# Patient Record
Sex: Male | Born: 1970 | Race: Black or African American | Hispanic: No | Marital: Married | Smoking: Never smoker
Health system: Southern US, Community
[De-identification: ages and names within clinical notes are randomized; demographics above are authoritative.]

## PROBLEM LIST (undated history)

## (undated) HISTORY — PX: APPENDECTOMY: SHX54

---

## 2012-08-27 ENCOUNTER — Ambulatory Visit: Payer: Managed Care, Other (non HMO)

## 2012-08-27 ENCOUNTER — Ambulatory Visit (INDEPENDENT_AMBULATORY_CARE_PROVIDER_SITE_OTHER): Payer: Managed Care, Other (non HMO) | Admitting: Family Medicine

## 2012-08-27 VITALS — BP 122/80 | HR 72 | Temp 98.4°F | Resp 16 | Ht 70.0 in | Wt 197.4 lb

## 2012-08-27 DIAGNOSIS — R079 Chest pain, unspecified: Secondary | ICD-10-CM

## 2012-08-27 DIAGNOSIS — Z0000 Encounter for general adult medical examination without abnormal findings: Secondary | ICD-10-CM

## 2012-08-27 DIAGNOSIS — D649 Anemia, unspecified: Secondary | ICD-10-CM

## 2012-08-27 LAB — IRON AND TIBC
%SAT: 30 % (ref 20–55)
Iron: 105 ug/dL (ref 42–165)
TIBC: 346 ug/dL (ref 215–435)
UIBC: 241 ug/dL (ref 125–400)

## 2012-08-27 LAB — POCT URINALYSIS DIPSTICK
Bilirubin, UA: NEGATIVE
Blood, UA: NEGATIVE
Glucose, UA: NEGATIVE
Leukocytes, UA: NEGATIVE
Nitrite, UA: NEGATIVE

## 2012-08-27 LAB — LIPID PANEL
Cholesterol: 192 mg/dL (ref 0–200)
Triglycerides: 234 mg/dL — ABNORMAL HIGH (ref <150)
VLDL: 47 mg/dL — ABNORMAL HIGH (ref 0–40)

## 2012-08-27 LAB — POCT CBC
Hemoglobin: 12.2 g/dL — AB (ref 14.1–18.1)
MCH, POC: 22 pg — AB (ref 27–31.2)
MPV: 9.2 fL (ref 0–99.8)
POC MID %: 7.8 %M (ref 0–12)
RBC: 5.55 M/uL (ref 4.69–6.13)
WBC: 4.9 10*3/uL (ref 4.6–10.2)

## 2012-08-27 LAB — COMPREHENSIVE METABOLIC PANEL
BUN: 14 mg/dL (ref 6–23)
CO2: 30 mEq/L (ref 19–32)
Calcium: 9.9 mg/dL (ref 8.4–10.5)
Chloride: 101 mEq/L (ref 96–112)
Creat: 1.16 mg/dL (ref 0.50–1.35)
Glucose, Bld: 85 mg/dL (ref 70–99)

## 2012-08-27 NOTE — Patient Instructions (Signed)
Costochondritis Costochondritis (Tietze syndrome), or costochondral separation, is a swelling and irritation (inflammation) of the tissue (cartilage) that connects your ribs with your breastbone (sternum). It may occur on its own (spontaneously), through damage caused by an accident (trauma), or simply from coughing or minor exercise. It may take up to 6 weeks to get better and longer if you are unable to be conservative in your activities. HOME CARE INSTRUCTIONS   Avoid exhausting physical activity. Try not to strain your ribs during normal activity. This would include any activities using chest, belly (abdominal), and side muscles, especially if heavy weights are used.  Use ice for 15 to 20 minutes per hour while awake for the first 2 days. Place the ice in a plastic bag, and place a towel between the bag of ice and your skin.  Only take over-the-counter or prescription medicines for pain, discomfort, or fever as directed by your caregiver. SEEK IMMEDIATE MEDICAL CARE IF:   Your pain increases or you are very uncomfortable.  You have a fever.  You develop difficulty with your breathing.  You cough up blood.  You develop worse chest pains, shortness of breath, sweating, or vomiting.  You develop new, unexplained problems (symptoms). MAKE SURE YOU:   Understand these instructions.  Will watch your condition.  Will get help right away if you are not doing well or get worse. Document Released: 08/12/2005 Document Revised: 01/25/2012 Document Reviewed: 06/20/2008 [employer] Patient Information 2013 [employer].  

## 2012-08-27 NOTE — Progress Notes (Signed)
[address] [place] [address]   Phone [phone]  Subjective:    Patient ID: Charles [name], male    DOB: 11/19/1970, 41 y.o.   MRN: [id number]  HPI Pt presents to clinic for CPE.  He is doing well except for some chest pain that has been going on since Jan 2013.  He was evaluated in [facility] but they told him they could find nothing wrong.  He states they said his chest xray "was black" he would like it repeated.  He is not a smoker.  The pain is intermittent and only anterior chest and seems worse with arm use/movement.  Seems worse after he exercises like doing pushups.  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  He has no pain with respirations and no SOB with the pain.  He does not know about his family hx.  Pt exercises regularly without any pain. Pt from [place].   Review of Systems  Constitutional: Negative.   HENT: Negative.   Eyes: Negative.   Respiratory: Negative.   Cardiovascular: Positive for chest pain. Negative for palpitations and leg swelling.  Gastrointestinal: Negative.   Genitourinary: Negative.   Musculoskeletal: Positive for myalgias. Negative for back pain and gait problem.  Skin: Negative.   Neurological: Negative.   Hematological: Negative.   Psychiatric/Behavioral: Negative.        Objective:   Physical Exam  Vitals reviewed. Constitutional: He is oriented to person, place, and time. He appears well-developed and well-nourished.  HENT:  Head: Normocephalic and atraumatic.  Right Ear: Hearing, tympanic membrane, external ear and ear canal normal.  Left Ear: Hearing, tympanic membrane, external ear and ear canal normal.  Nose: Nose normal.  Mouth/Throat: Uvula is midline.  Eyes: Conjunctivae normal and EOM are normal. Pupils are equal, round, and reactive to light.  Neck: Normal range of motion. Neck supple.  Cardiovascular: Normal rate, regular  rhythm and normal heart sounds.        Some TTP of chest wall over sternal border. No carotid bruits.  Pulmonary/Chest: Effort normal and breath sounds normal.  Abdominal: Soft. Bowel sounds are normal. Hernia confirmed negative in the right inguinal area and confirmed negative in the left inguinal area.  Genitourinary: Penis normal.  Musculoskeletal: Normal range of motion.  Neurological: He is alert and oriented to person, place, and time. He has normal reflexes.  Skin: Skin is warm and dry.  Psychiatric: He has a normal mood and affect. His behavior is normal. Judgment and thought content normal.   Results for orders placed in visit on 08/27/12  POCT CBC      Component Value Range   WBC 4.9  4.6 - 10.2 K/uL   Lymph, poc 2.3  0.6 - 3.4   POC LYMPH PERCENT 46.0  10 - 50 %L   MID (cbc) 0.4  0 - 0.9   POC MID % 7.8  0 - 12 %M   POC Granulocyte 2.3  2 - 6.9   Granulocyte percent 46.2  37 - 80 %G   RBC 5.55  4.69 - 6.13 M/uL   Hemoglobin 12.2 (*) 14.1 - 18.1 g/dL   HCT, POC 21.3 (*) 08.6 - 53.7 %   MCV 73.3 (*) 80 - 97 fL   MCH, POC 22.0 (*) 27 - 31.2 pg   MCHC 30.0 (*) 31.8 - 35.4 g/dL  RDW, POC 14.6     Platelet Count, POC 241  142 - 424 K/uL   MPV 9.2  0 - 99.8 fL  POCT URINALYSIS DIPSTICK      Component Value Range   Color, UA yellow     Clarity, UA clear     Glucose, UA neg     Bilirubin, UA neg     Ketones, UA trace     Spec Grav, UA 1.020     Blood, UA neg     pH, UA 6.0     Protein, UA neg     Urobilinogen, UA 0.2     Nitrite, UA neg     Leukocytes, UA Negative     EKG - NSR without actue changes.  [facility] reading (PRIMARY) by  Dr. Katrinka Blazing. NAD.         Assessment & Plan:   1. Annual physical exam  POCT CBC, POCT urinalysis dipstick, TSH, Lipid panel, Comprehensive metabolic panel  2. Chest pain  EKG 12-Lead, DG Chest 2 View  3. Anemia  Iron and TIBC, Hemoglobinopathy evaluation   1- check labs  2- musculoskeletal chest pain - pt should work on back  exercises to help with his increased used of chest wall muscles with his job and he can use OTC NSAIDs 3- check labs due to anemia - expect to have a hemoglobinopathy  Answered questions that patient had.  D/w Dr. Katrinka Blazing.

## 2012-08-29 NOTE — Progress Notes (Signed)
Reviewed HPI, physical exam, EKG, CXR with Benny Lennert, PA-C.  Agree with assessment and plan.  KMS

## 2012-09-14 NOTE — Progress Notes (Signed)
Reviewed and agree.

## 2012-11-07 ENCOUNTER — Encounter (HOSPITAL_COMMUNITY): Payer: Self-pay | Admitting: *Deleted

## 2012-11-07 ENCOUNTER — Emergency Department (HOSPITAL_COMMUNITY)
Admission: EM | Admit: 2012-11-07 | Discharge: 2012-11-07 | Discharge status: Against Medical Advice | Payer: Managed Care, Other (non HMO) | Attending: Emergency Medicine | Admitting: Emergency Medicine

## 2012-11-07 DIAGNOSIS — R1012 Left upper quadrant pain: Secondary | ICD-10-CM | POA: Insufficient documentation

## 2012-11-07 LAB — CBC WITH DIFFERENTIAL/PLATELET
Basophils Absolute: 0 K/uL (ref 0.0–0.1)
Basophils Relative: 0 % (ref 0–1)
Eosinophils Absolute: 0.2 K/uL (ref 0.0–0.7)
Eosinophils Relative: 4 % (ref 0–5)
HCT: 42.8 % (ref 39.0–52.0)
Hemoglobin: 14 g/dL (ref 13.0–17.0)
Lymphocytes Relative: 46 % (ref 12–46)
Lymphs Abs: 2 K/uL (ref 0.7–4.0)
MCH: 23 pg — ABNORMAL LOW (ref 26.0–34.0)
MCHC: 32.7 g/dL (ref 30.0–36.0)
MCV: 70.2 fL — ABNORMAL LOW (ref 78.0–100.0)
Monocytes Absolute: 0.3 K/uL (ref 0.1–1.0)
Monocytes Relative: 6 % (ref 3–12)
Neutro Abs: 1.9 K/uL (ref 1.7–7.7)
Neutrophils Relative %: 44 % (ref 43–77)
Platelets: 186 K/uL (ref 150–400)
RBC: 6.1 MIL/uL — ABNORMAL HIGH (ref 4.22–5.81)
RDW: 14 % (ref 11.5–15.5)
WBC: 4.4 K/uL (ref 4.0–10.5)

## 2012-11-07 LAB — URINALYSIS, ROUTINE W REFLEX MICROSCOPIC
Bilirubin Urine: NEGATIVE
Glucose, UA: NEGATIVE mg/dL
Ketones, ur: NEGATIVE mg/dL
Nitrite: NEGATIVE
Specific Gravity, Urine: 1.024 (ref 1.005–1.030)
pH: 7 (ref 5.0–8.0)

## 2012-11-07 LAB — LIPASE, BLOOD: Lipase: 54 U/L (ref 11–59)

## 2012-11-07 LAB — BASIC METABOLIC PANEL
BUN: 9 mg/dL (ref 6–23)
CO2: 30 mEq/L (ref 19–32)
Chloride: 101 mEq/L (ref 96–112)
Creatinine, Ser: 1.03 mg/dL (ref 0.50–1.35)
GFR calc Af Amer: >90 mL/min (ref >90)
Glucose, Bld: 111 mg/dL — ABNORMAL HIGH (ref 70–99)
Potassium: 4.1 mEq/L (ref 3.5–5.1)

## 2012-11-07 NOTE — ED Notes (Signed)
Pt is here with a one month LUQ air feeling and has pain with bending over and gets choked from the pain.  Pt in no distress currently

## 2012-11-07 NOTE — ED Notes (Signed)
Secretary states pt came up to nurses station c/o he had not seen the doctor. Pt went back to room, put clothes on and walked out.

## 2013-02-14 ENCOUNTER — Emergency Department (HOSPITAL_BASED_OUTPATIENT_CLINIC_OR_DEPARTMENT_OTHER)
Admission: EM | Admit: 2013-02-14 | Discharge: 2013-02-14 | Disposition: A | Discharge status: Home / Self Care | Payer: Managed Care, Other (non HMO) | Attending: Emergency Medicine | Admitting: Emergency Medicine

## 2013-02-14 ENCOUNTER — Encounter (HOSPITAL_BASED_OUTPATIENT_CLINIC_OR_DEPARTMENT_OTHER): Payer: Self-pay | Admitting: *Deleted

## 2013-02-14 ENCOUNTER — Emergency Department (HOSPITAL_BASED_OUTPATIENT_CLINIC_OR_DEPARTMENT_OTHER): Payer: Managed Care, Other (non HMO)

## 2013-02-14 ENCOUNTER — Other Ambulatory Visit: Payer: Self-pay

## 2013-02-14 DIAGNOSIS — J020 Streptococcal pharyngitis: Secondary | ICD-10-CM

## 2013-02-14 DIAGNOSIS — R0789 Other chest pain: Secondary | ICD-10-CM | POA: Insufficient documentation

## 2013-02-14 DIAGNOSIS — R509 Fever, unspecified: Secondary | ICD-10-CM | POA: Insufficient documentation

## 2013-02-14 LAB — CBC WITH DIFFERENTIAL/PLATELET
Basophils Relative: 0 % (ref 0–1)
Eosinophils Relative: 0 % (ref 0–5)
HCT: 38.1 % — ABNORMAL LOW (ref 39.0–52.0)
Hemoglobin: 12.9 g/dL — ABNORMAL LOW (ref 13.0–17.0)
Lymphocytes Relative: 12 % (ref 12–46)
MCH: 22.9 pg — ABNORMAL LOW (ref 26.0–34.0)
MCHC: 33.9 g/dL (ref 30.0–36.0)
Neutro Abs: 7.5 10*3/uL (ref 1.7–7.7)
Neutrophils Relative %: 79 % — ABNORMAL HIGH (ref 43–77)
RBC: 5.64 MIL/uL (ref 4.22–5.81)

## 2013-02-14 LAB — COMPREHENSIVE METABOLIC PANEL
ALT: 31 U/L (ref 0–53)
Alkaline Phosphatase: 50 U/L (ref 39–117)
BUN: 12 mg/dL (ref 6–23)
CO2: 23 mEq/L (ref 19–32)
GFR calc Af Amer: 87 mL/min — ABNORMAL LOW (ref >90)
GFR calc non Af Amer: 75 mL/min — ABNORMAL LOW (ref >90)
Glucose, Bld: 137 mg/dL — ABNORMAL HIGH (ref 70–99)
Potassium: 3.4 mEq/L — ABNORMAL LOW (ref 3.5–5.1)
Sodium: 136 mEq/L (ref 135–145)
Total Bilirubin: 0.6 mg/dL (ref 0.3–1.2)

## 2013-02-14 LAB — URINALYSIS, ROUTINE W REFLEX MICROSCOPIC
Bilirubin Urine: NEGATIVE
Ketones, ur: 15 mg/dL — AB
Nitrite: NEGATIVE
Protein, ur: 30 mg/dL — AB
Urobilinogen, UA: 1 mg/dL (ref 0.0–1.0)

## 2013-02-14 LAB — URINE MICROSCOPIC-ADD ON

## 2013-02-14 MED ORDER — CEPHALEXIN 500 MG PO CAPS: 500.0000 mg | ORAL_CAPSULE | Freq: Four times a day (QID) | ORAL | Status: DC

## 2013-02-14 MED ORDER — ACETAMINOPHEN 325 MG PO TABS
ORAL_TABLET | ORAL | Status: AC
  Administered 2013-02-14: 325 mg via ORAL
  Filled 2013-02-14: qty 1

## 2013-02-14 MED ORDER — ACETAMINOPHEN 325 MG PO TABS
325.0000 mg | ORAL_TABLET | Freq: Once | ORAL | Status: AC
  Administered 2013-02-14: 325 mg via ORAL

## 2013-02-14 MED ORDER — ACETAMINOPHEN 325 MG PO TABS
650.0000 mg | ORAL_TABLET | Freq: Once | ORAL | Status: AC
  Administered 2013-02-14: 650 mg via ORAL
  Filled 2013-02-14: qty 2

## 2013-02-14 NOTE — ED Notes (Signed)
MD at bedside. 

## 2013-02-14 NOTE — ED Notes (Signed)
Fever, headache, vomiting and sharp pain in his left chest when be moves.

## 2013-02-14 NOTE — ED Provider Notes (Signed)
History     CSN: [id number]  Arrival date & time 02/14/13  1528   First MD Initiated Contact with Patient 02/14/13 1544      Chief Complaint  Patient presents with  . Fever    (Consider location/radiation/quality/duration/timing/severity/associated sxs/prior treatment) HPI Comments: Patient presents with generalized malaise, body aches, chest and abdominal discomfort for the past 24 hours, getting worse.  Has felt fevered at home.  Patient is a 42 y.o. Charles [name] presenting with fever. The history is provided by the patient.  Fever Temp source:  Subjective Severity:  Moderate Onset quality:  Sudden Duration:  1 day Timing:  Constant Progression:  Worsening Chronicity:  New Relieved by:  Nothing Worsened by:  Nothing tried Associated symptoms: chest pain, chills and sore throat   Associated symptoms: no cough, no dysuria and no rash     History reviewed. No pertinent past medical history.  Past Surgical History  Procedure Laterality Date  . Appendectomy      No family history on file.  History  Substance Use Topics  . Smoking status: Never Smoker   . Smokeless tobacco: Not on file  . Alcohol Use: Yes      Review of Systems  Constitutional: Positive for fever and chills.  HENT: Positive for sore throat.   Respiratory: Negative for cough.   Cardiovascular: Positive for chest pain.  Genitourinary: Negative for dysuria.  Skin: Negative for rash.  All other systems reviewed and are negative.    Allergies  Review of patient's allergies indicates no known allergies.  Home Medications   Current Outpatient Rx  Name  Route  Sig  Dispense  Refill  . Multiple Vitamin (MULTIVITAMIN) capsule   Oral   Take 1 capsule by mouth daily.         Marland Kitchen omega-3 acid ethyl esters (LOVAZA) 1 G capsule   Oral   Take 2 g by mouth 2 (two) times daily.           BP 121/73  Pulse 109  Temp(Src) 102.8 F (39.3 C) (Oral)  Resp 26  Wt 197 lb (89.359 kg)  BMI 28.27 kg/m2   SpO2 100%  Physical Exam  Nursing note and vitals reviewed. Constitutional: He is oriented to person, place, and time. He appears well-developed and well-nourished. No distress.  HENT:  Head: Normocephalic and atraumatic.  Right Ear: External ear normal.  Left Ear: External ear normal.  PO erythematous, no exudates.  Neck: Normal range of motion. Neck supple.  Cardiovascular: Normal rate and regular rhythm.   No murmur heard. Pulmonary/Chest: Effort normal and breath sounds normal. No respiratory distress. He has no wheezes.  Abdominal: Soft. Bowel sounds are normal. He exhibits no distension. There is no tenderness.  Musculoskeletal: Normal range of motion. He exhibits no edema.  Lymphadenopathy:    He has no cervical adenopathy.  Neurological: He is alert and oriented to person, place, and time.  Skin: Skin is warm and dry. He is not diaphoretic.    ED Course  Procedures (including critical care time)  Labs Reviewed  RAPID STREP SCREEN - Abnormal; Notable for the following:    Streptococcus, Group A Screen (Direct) POSITIVE (*)    All other components within normal limits  CBC WITH DIFFERENTIAL - Abnormal; Notable for the following:    Hemoglobin 12.9 (*)    HCT 38.1 (*)    MCV 67.6 (*)    MCH 22.9 (*)    Neutrophils Relative 79 (*)    All other  components within normal limits  COMPREHENSIVE METABOLIC PANEL - Abnormal; Notable for the following:    Potassium 3.4 (*)    Glucose, Bld 137 (*)    GFR calc non Af Amer 75 (*)    GFR calc Af Amer 87 (*)    All other components within normal limits  URINALYSIS, ROUTINE W REFLEX MICROSCOPIC - Abnormal; Notable for the following:    pH 8.5 (*)    Ketones, ur 15 (*)    Protein, ur 30 (*)    All other components within normal limits  URINE MICROSCOPIC-ADD ON   Dg Chest 2 View  02/14/2013  *RADIOLOGY REPORT*  Clinical Data: Fever, left-sided chest pain  CHEST - 2 VIEW  Comparison: 08/27/2012  Findings: Cardiomediastinal  silhouette is stable.  No acute infiltrate or pleural effusion.  No pulmonary edema.  Bony thorax is unremarkable.  IMPRESSION:  No active disease.   Original Report Authenticated By: Natasha Mead, M.D.      No diagnosis found.   Date: 02/14/2013  Rate: 101  Rhythm: sinus tachycardia  QRS Axis: normal  Intervals: normal  ST/T Wave abnormalities: nonspecific T wave changes  Conduction Disutrbances:none  Narrative Interpretation:   Old EKG Reviewed: unchanged    MDM  The patient presents with sore throat, chest and abdominal pain, and generalized malaise for the past day.  The labs, ekg, and chest xray all are unremarkable. The labs are normal with the exception of a positive strep test.  His temp has improved and he is feeling better.  Will treat with keflex for strep, return prn.        Geoffery Lyons, MD 02/14/13 1743

## 2013-02-14 NOTE — ED Notes (Signed)
D/c home with ride- rx x 1 given for cephalexin

## 2013-02-14 NOTE — Discharge Instructions (Signed)
Tylenol 1000 mg rotated every four hours with Motrin 600 mg as needed for fever / pain.   Sore Throat Sore throats may be caused by bacteria and viruses. They may also be caused by:  Smoking.  Pollution.  Allergies. If a sore throat is due to strep infection (a bacterial infection), you may need:  A throat swab.  A culture test to verify the strep infection. You will need one of these:  An antibiotic shot.  Oral medicine for a full 10 days. Strep infection is very contagious. A doctor should check any close contacts who have a sore throat or fever. A sore throat caused by a virus infection will usually last only 3-4 days. Antibiotics will not treat a viral sore throat.  Infectious mononucleosis (a viral disease), however, can cause a sore throat that lasts for up to 3 weeks. Mononucleosis can be diagnosed with blood tests. You must have been sick for at least 1 week in order for the test to give accurate results. HOME CARE INSTRUCTIONS   To treat a sore throat, take mild pain medicine.  Increase your fluids.  Eat a soft diet.  Do not smoke.  Gargling with warm water or salt water (1 tsp. salt in 8 oz. water) can be helpful.  Try throat sprays or lozenges or sucking on hard candy to ease the symptoms. Call your doctor if your sore throat lasts longer than 1 week.  SEEK IMMEDIATE MEDICAL CARE IF:  You have difficulty breathing.  You have increased swelling in the throat.  You have pain so severe that you are unable to swallow fluids or your saliva.  You have a severe headache, a high fever, vomiting, or a red rash. Document Released: 12/10/2004 Document Revised: 01/25/2012 Document Reviewed: 10/20/2007 [facility] Patient Information 2013 [place], [place].

## 2013-02-14 NOTE — ED Notes (Signed)
Patient transported to X-ray 

## 2013-07-08 IMAGING — CR DG CHEST 2V
2 series · 2 of 2 positions shown · non-contrast
Comparison: No priors.

CLINICAL DATA: Chest pain.

CHEST - 2 VIEW

[PA]
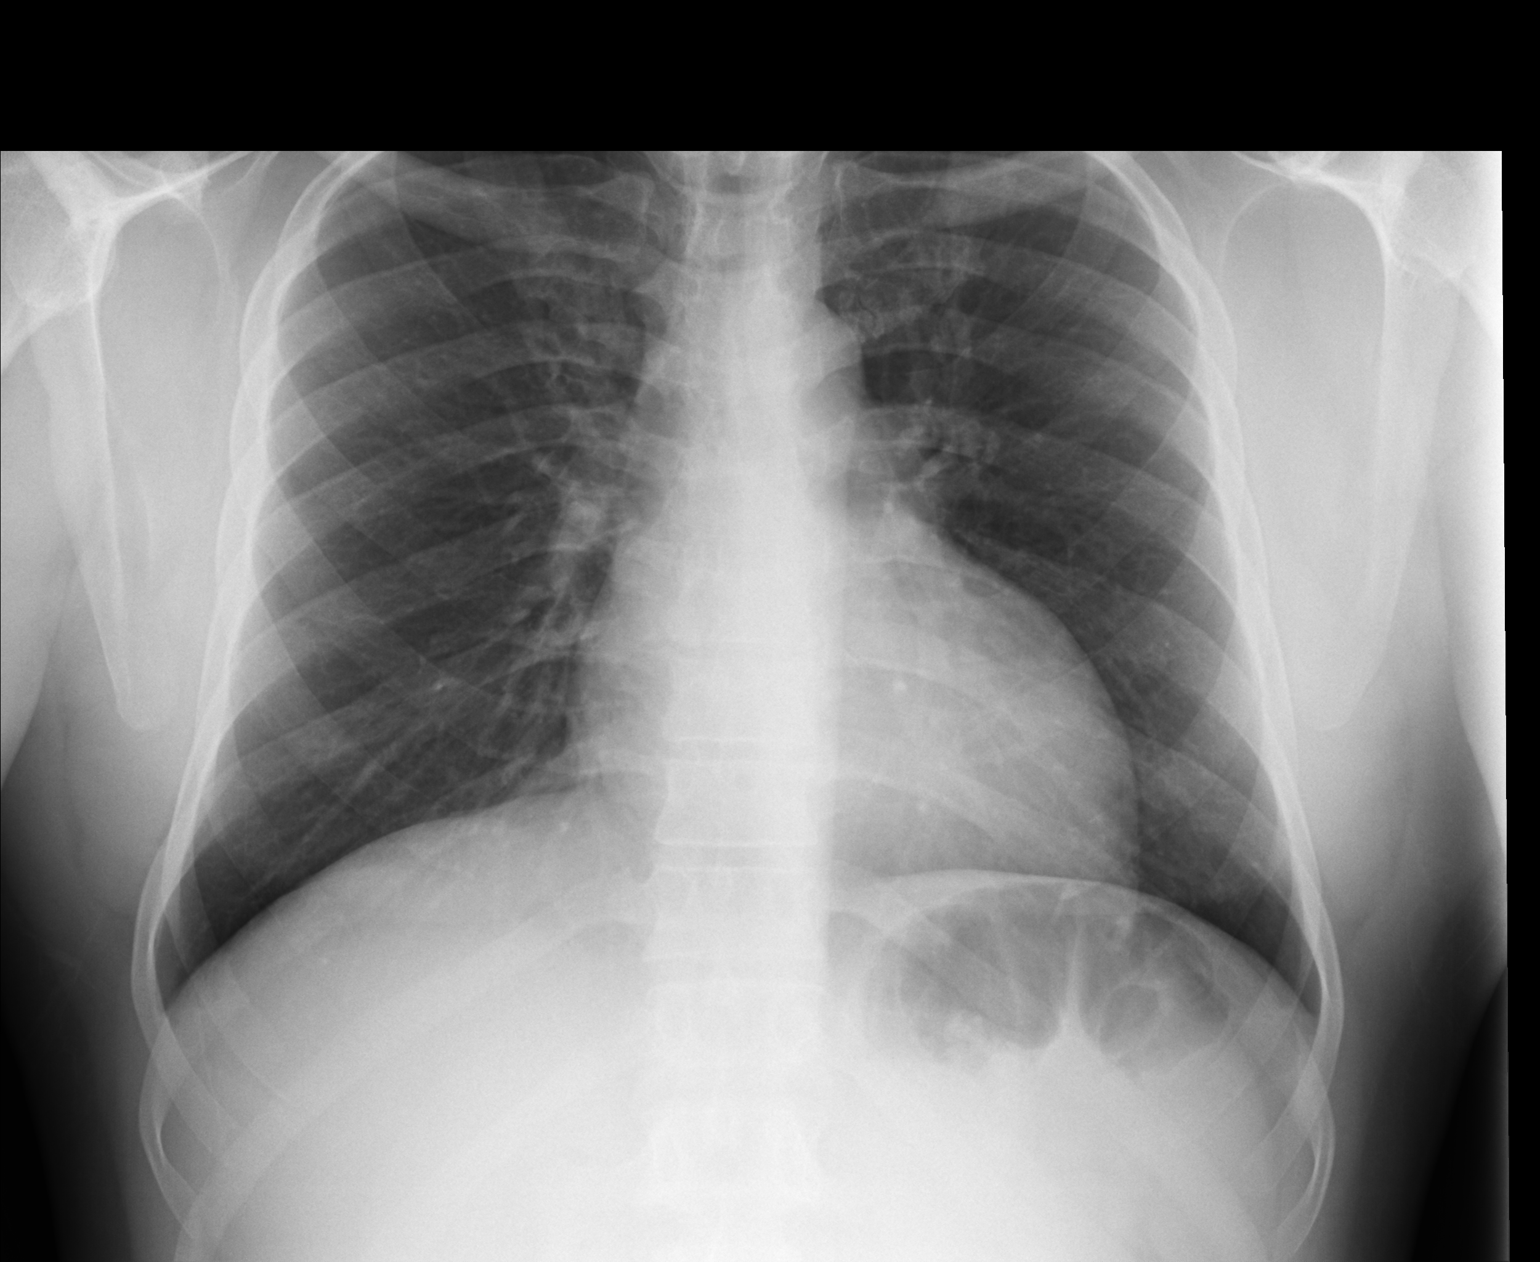

[lateral]
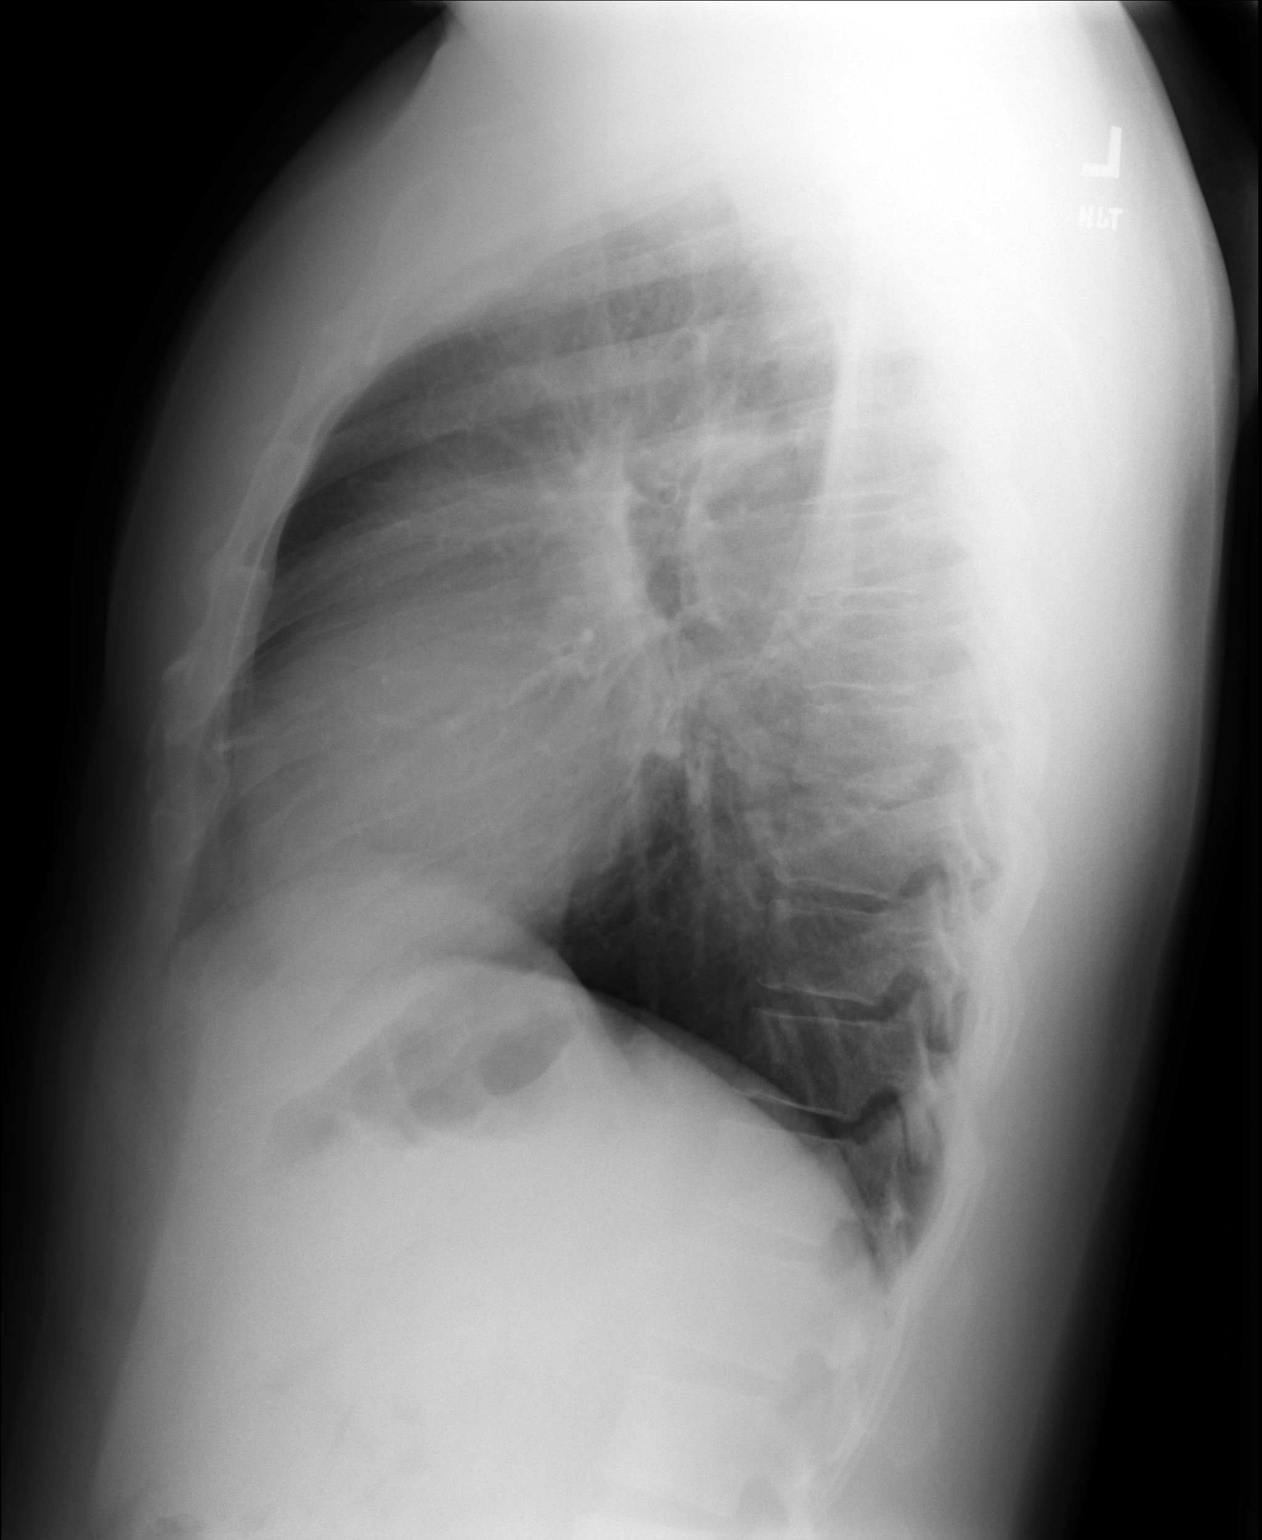

[2 of 2 positions shown; findings below may reference images not displayed]

FINDINGS: Lung volumes are normal.  No consolidative airspace
disease.  No pleural effusions.  No pneumothorax.  No pulmonary
nodule or mass noted.  Pulmonary vasculature and the
cardiomediastinal silhouette are within normal limits.
IMPRESSION: 1. No radiographic evidence of acute cardiopulmonary disease.

## 2016-04-07 ENCOUNTER — Telehealth: Payer: Self-pay

## 2016-04-07 NOTE — Telephone Encounter (Signed)
Did we get [facility] fax [facility] [employer]?    [phone]

## 2023-09-05 ENCOUNTER — Encounter: Payer: Self-pay | Admitting: Emergency Medicine

## 2023-09-05 ENCOUNTER — Emergency Department
Admission: EM | Admit: 2023-09-05 | Discharge: 2023-09-05 | Disposition: A | Discharge status: Home / Self Care | Payer: 59 | Attending: Emergency Medicine | Admitting: Emergency Medicine

## 2023-09-05 ENCOUNTER — Emergency Department: Payer: 59

## 2023-09-05 ENCOUNTER — Other Ambulatory Visit: Payer: Self-pay

## 2023-09-05 DIAGNOSIS — R0789 Other chest pain: Secondary | ICD-10-CM | POA: Diagnosis not present

## 2023-09-05 DIAGNOSIS — R079 Chest pain, unspecified: Secondary | ICD-10-CM

## 2023-09-05 LAB — BASIC METABOLIC PANEL
Anion gap: 8 (ref 5–15)
BUN: 8 mg/dL (ref 6–20)
CO2: 26 mmol/L (ref 22–32)
Calcium: 9 mg/dL (ref 8.9–10.3)
Chloride: 103 mmol/L (ref 98–111)
Creatinine, Ser: 1 mg/dL (ref 0.61–1.24)
GFR, Estimated: >60 mL/min (ref >60)
Glucose, Bld: 128 mg/dL — ABNORMAL HIGH (ref 70–99)
Potassium: 3.4 mmol/L — ABNORMAL LOW (ref 3.5–5.1)
Sodium: 137 mmol/L (ref 135–145)

## 2023-09-05 LAB — CBC
HCT: 33 % — ABNORMAL LOW (ref 39.0–52.0)
Hemoglobin: 10.9 g/dL — ABNORMAL LOW (ref 13.0–17.0)
MCH: 27.7 pg (ref 26.0–34.0)
MCHC: 33 g/dL (ref 30.0–36.0)
MCV: 83.8 fL (ref 80.0–100.0)
Platelets: 189 10*3/uL (ref 150–400)
RBC: 3.94 MIL/uL — ABNORMAL LOW (ref 4.22–5.81)
RDW: 14.9 % (ref 11.5–15.5)
WBC: 4.3 10*3/uL (ref 4.0–10.5)
nRBC: 0 % (ref 0.0–0.2)

## 2023-09-05 LAB — TROPONIN I (HIGH SENSITIVITY): Troponin I (High Sensitivity): 3 ng/L (ref <18)

## 2023-09-05 NOTE — ED Triage Notes (Signed)
Patient to ED via [facility] for intermittent CP for the past 5 months. States pain on the left side feels like a pressure. States he "feels like there is air somewhere." Denies cardiac hx

## 2023-09-05 NOTE — ED Provider Triage Note (Signed)
Emergency Medicine Provider Triage Evaluation Note  Charles [name] , a 52 y.o. male  was evaluated in triage.  Pt complains of intermittent CP for 6-7 months. Denies cardiac history. Rates pain a 9/10. Describes the pain as a pressure. No radiation.   Review of Systems  Positive: CP Negative: SOB  Physical Exam  There were no vitals taken for this visit. Gen:   Awake, no distress   Resp:  Normal effort  MSK:   Moves extremities without difficulty  Other:    Medical Decision Making  Medically screening exam initiated at 4:23 PM.  Appropriate orders placed.  Charles [name] was informed that the remainder of the evaluation will be completed by another provider, this initial triage assessment does not replace that evaluation, and the importance of remaining in the ED until their evaluation is complete.     Charles Ali, PA-C 09/05/23 1627

## 2023-09-10 ENCOUNTER — Ambulatory Visit: Payer: 59 | Attending: Cardiology | Admitting: Cardiology

## 2023-09-10 ENCOUNTER — Encounter: Payer: Self-pay | Admitting: Cardiology

## 2023-09-10 VITALS — BP 138/92 | HR 64 | Ht 71.0 in | Wt 184.0 lb

## 2023-09-10 DIAGNOSIS — E782 Mixed hyperlipidemia: Secondary | ICD-10-CM

## 2023-09-10 DIAGNOSIS — R072 Precordial pain: Secondary | ICD-10-CM | POA: Diagnosis not present

## 2023-09-10 DIAGNOSIS — R030 Elevated blood-pressure reading, without diagnosis of hypertension: Secondary | ICD-10-CM

## 2023-09-10 MED ORDER — METOPROLOL TARTRATE 100 MG PO TABS
100.0000 mg | ORAL_TABLET | Freq: Once | ORAL | 0 refills | Status: AC
Start: 2023-09-10 — End: 2023-09-10

## 2023-09-10 NOTE — Progress Notes (Signed)
Cardiology Office Note:    Date:  09/10/2023   ID:  Charles [name], DOB Dec 30, 1970, MRN [id number]  PCP:  Patient, No Pcp Per    [facility] Providers Cardiologist:  Debbe Odea, MD     Referring MD: Jene Every, MD   Chief Complaint  Patient presents with   New Patient (Initial Visit)    Referred for cardiac evaluation of Nonspecific chest pain.  Patient presented to ED on 09/05/23 with intermittent chest pains for 5 months with no known cardiac history.    Charles [name] is a 52 y.o. male who is being seen today for the evaluation of chest pain at the request of Jene Every, MD.   History of Present Illness:    Charles [name] is a 52 y.o. male with no significant past medical history who presents with chest pain.  Patient states having chest discomfort over the past several months.  Symptoms usually get worse with exertion.  Sometimes notices a gas bubble sensation in the epigastric area.  Denies smoking, denies any family history of heart disease.  Has not seen a physician for some time now.  He is a [employer], sometimes eats fast food.  He tries to eat home-cooked meals when he can.  History reviewed. No pertinent past medical history.  Past Surgical History:  Procedure Laterality Date   APPENDECTOMY      Current Medications: Current Meds  Medication Sig   metoprolol tartrate (LOPRESSOR) 100 MG tablet Take 1 tablet (100 mg total) by mouth once for 1 dose. TWO HOURS PRIOR TO CARDIAC CTA     Allergies:   Patient has no known allergies.   Social History   Socioeconomic History   Marital status: Single    Spouse name: Not on file   Number of children: Not on file   Years of education: Not on file   Highest education level: Not on file  Occupational History   Not on file  Tobacco Use   Smoking status: Never   Smokeless tobacco: Not on file  Vaping Use   Vaping status: Never Used  Substance and Sexual Activity   Alcohol use:  Not Currently   Drug use: No   Sexual activity: Yes  Other Topics Concern   Not on file  Social History Narrative   Not on file   Social Determinants of Health   Financial Resource Strain: Not on file  Food Insecurity: Not on file  Transportation Needs: Not on file  Physical Activity: Not on file  Stress: Not on file  Social Connections: Not on file     Family History: The patient's family history is negative for Heart disease.  ROS:   Please see the history of present illness.     All other systems reviewed and are negative.  EKGs/Labs/Other Studies Reviewed:    The following studies were reviewed today:  EKG Interpretation Date/Time:  Friday September 10 2023 09:35:46 EDT Ventricular Rate:  64 PR Interval:  140 QRS Duration:  86 QT Interval:  416 QTC Calculation: 429 R Axis:   71  Text Interpretation: Normal sinus rhythm with sinus arrhythmia Normal ECG Confirmed by Debbe Odea ([id number]) on 09/10/2023 9:43:11 AM    Recent Labs: 09/05/2023: BUN 8; Creatinine, Ser 1.00; Hemoglobin 10.9; Platelets 189; Potassium 3.4; Sodium 137  Recent Lipid Panel    Component Value Date/Time   CHOL 192 08/27/2012 1204   TRIG 234 (H) 08/27/2012 1204   HDL 34 (L) 08/27/2012 1204  CHOLHDL 5.6 08/27/2012 1204   VLDL 47 (H) 08/27/2012 1204   LDLCALC 111 (H) 08/27/2012 1204     Risk Assessment/Calculations:     HYPERTENSION CONTROL Vitals:   09/10/23 0930 09/10/23 0931  BP: (!) 140/78 (!) 138/92    The patient's blood pressure is elevated above target today.  In order to address the patient's elevated BP: Blood pressure will be monitored at home to determine if medication changes need to be made.            Physical Exam:    VS:  BP (!) 138/92 (BP Location: Right Arm, Patient Position: Sitting, Cuff Size: Normal)   Pulse 64   Ht 5\' 11"  (1.803 m)   Wt 184 lb (83.5 kg)   BMI 25.66 kg/m     Wt Readings from Last 3 Encounters:  09/10/23 184 lb (83.5 kg)   09/05/23 200 lb (90.7 kg)  02/14/13 197 lb (89.4 kg)     GEN:  Well nourished, well developed in no acute distress HEENT: Normal NECK: No JVD; No carotid bruits CARDIAC: RRR, no murmurs, rubs, gallops RESPIRATORY:  Clear to auscultation without rales, wheezing or rhonchi  ABDOMEN: Soft, non-tender, non-distended MUSCULOSKELETAL:  No edema; No deformity  SKIN: Warm and dry NEUROLOGIC:  Alert and oriented x 3 PSYCHIATRIC:  Normal affect   ASSESSMENT:    1. Precordial pain   2. Elevated BP without diagnosis of hypertension   3. Mixed hyperlipidemia    PLAN:    In order of problems listed above:  Chest pain, history of hyperlipidemia, BP elevated today.  Symptoms consistent with angina.  Obtain echocardiogram, obtain coronary CTA to evaluate CAD. Hypertension, low-salt diet advised.  Monitor BP at home and keep log. 3.  History of hyperlipidemia, obtain fasting lipid profile.  Follow-up after cardiac testing     Medication Adjustments/Labs and Tests Ordered: Current medicines are reviewed at length with the patient today.  Concerns regarding medicines are outlined above.  Orders Placed This Encounter  Procedures   CT CORONARY MORPH W/CTA COR W/SCORE W/CA W/CM &/OR WO/CM   Basic Metabolic Panel (BMET)   Lipid panel   EKG 12-Lead   ECHOCARDIOGRAM COMPLETE   Meds ordered this encounter  Medications   metoprolol tartrate (LOPRESSOR) 100 MG tablet    Sig: Take 1 tablet (100 mg total) by mouth once for 1 dose. TWO HOURS PRIOR TO CARDIAC CTA    Dispense:  1 tablet    Refill:  0    Patient Instructions  Medication Instructions:   Your physician recommends that you continue on your current medications as directed. Please refer to the Current Medication list given to you today.  *If you need a refill on your cardiac medications before your next appointment, please call your pharmacy*   Lab Work:  Your physician recommends you have labs today - BMP / Lipid  If you  have labs (blood work) drawn today and your tests are completely normal, you will receive your results only by: MyChart Message (if you have MyChart) OR A paper copy in the mail If you have any lab test that is abnormal or we need to change your treatment, we will call you to review the results.   Testing/Procedures:  Your physician has requested that you have an echocardiogram. Echocardiography is a painless test that uses sound waves to create images of your heart. It provides your doctor with information about the size and shape of your heart and how well your  heart's chambers and valves are working. This procedure takes approximately one hour. There are no restrictions for this procedure. Please do NOT wear cologne, perfume, aftershave, or lotions (deodorant is allowed). Please arrive 15 minutes prior to your appointment time.    Your cardiac CT will be scheduled at one of the below locations:   [facility] [address] [place], [place] [address] [phone]  OR   [facility] [address] [place], [place] [address] [phone]  If scheduled at [facility] or [facility], please arrive 15 mins early for check-in and test prep.  There is spacious parking and easy access to the radiology department from the [facility] entrance. Please enter here and check-in with the desk attendant.   Please follow these instructions carefully (unless otherwise directed):  An IV will be required for this test and Nitroglycerin will be given.  Hold all erectile dysfunction medications at least 3 days (72 hrs) prior to test. (Ie viagra, cialis, sildenafil, tadalafil, etc)   On the Night Before the Test: Be sure to Drink plenty of water. Do not consume any caffeinated/decaffeinated beverages or chocolate 12 hours prior to your test. Do not take any  antihistamines 12 hours prior to your test.  On the Day of the Test: Drink plenty of water until 1 hour prior to the test. Do not eat any food 1 hour prior to test. You may take your regular medications prior to the test.  Take metoprolol (Lopressor) two hours prior to test.      After the Test: Drink plenty of water. After receiving IV contrast, you may experience a mild flushed feeling. This is normal. On occasion, you may experience a mild rash up to 24 hours after the test. This is not dangerous. If this occurs, you can take Benadryl 25 mg and increase your fluid intake. If you experience trouble breathing, this can be serious. If it is severe call 911 IMMEDIATELY. If it is mild, please call our office. If you take any of these medications: Glipizide/Metformin, Avandament, Glucavance, please do not take 48 hours after completing test unless otherwise instructed.  We will call to schedule your test 2-4 weeks out understanding that some insurance companies will need an authorization prior to the service being performed.   For more information and frequently asked questions, please visit our website : [email or web address]  For non-scheduling related questions, please contact the cardiac imaging nurse navigator should you have any questions/concerns: Cardiac Imaging Nurse Navigators Direct Office Dial: [phone]   For scheduling needs, including cancellations and rescheduling, please call [place], [phone].   Follow-Up: At [facility], you and your health needs are our priority.  As part of our continuing mission to provide you with exceptional heart care, we have created designated Provider Care Teams.  These Care Teams include your primary Cardiologist (physician) and Advanced Practice Providers (APPs -  Physician Assistants and Nurse Practitioners) who all work together to provide you with the care you need, when you need it.  We recommend signing up for  the patient portal called "MyChart".  Sign up information is provided on this After Visit Summary.  MyChart is used to connect with patients for Virtual Visits (Telemedicine).  Patients are able to view lab/test results, encounter notes, upcoming appointments, etc.  Non-urgent messages can be sent to your provider as well.   To learn more about what you can do with  MyChart, go to [email or web address].    Your next appointment:    2 months   Provider:   Debbe Odea, MD ONLY     Signed, Debbe Odea, MD  09/10/2023 10:17 AM    [facility]

## 2023-09-10 NOTE — Patient Instructions (Signed)
Medication Instructions:   Your physician recommends that you continue on your current medications as directed. Please refer to the Current Medication list given to you today.  *If you need a refill on your cardiac medications before your next appointment, please call your pharmacy*   Lab Work:  Your physician recommends you have labs today - BMP / Lipid  If you have labs (blood work) drawn today and your tests are completely normal, you will receive your results only by: MyChart Message (if you have MyChart) OR A paper copy in the mail If you have any lab test that is abnormal or we need to change your treatment, we will call you to review the results.   Testing/Procedures:  Your physician has requested that you have an echocardiogram. Echocardiography is a painless test that uses sound waves to create images of your heart. It provides your doctor with information about the size and shape of your heart and how well your heart's chambers and valves are working. This procedure takes approximately one hour. There are no restrictions for this procedure. Please do NOT wear cologne, perfume, aftershave, or lotions (deodorant is allowed). Please arrive 15 minutes prior to your appointment time.    Your cardiac CT will be scheduled at one of the below locations:   [facility] [address] [place], [place] [address] [phone]  OR   [facility] [address] [place], [place] [address] [phone]  If scheduled at [facility] or [facility], please arrive 15 mins early for check-in and test prep.  There is spacious parking and easy access to the radiology department from the [facility] entrance. Please enter here and check-in with the desk attendant.   Please follow these instructions carefully (unless otherwise directed):  An IV will be required  for this test and Nitroglycerin will be given.  Hold all erectile dysfunction medications at least 3 days (72 hrs) prior to test. (Ie viagra, cialis, sildenafil, tadalafil, etc)   On the Night Before the Test: Be sure to Drink plenty of water. Do not consume any caffeinated/decaffeinated beverages or chocolate 12 hours prior to your test. Do not take any antihistamines 12 hours prior to your test.  On the Day of the Test: Drink plenty of water until 1 hour prior to the test. Do not eat any food 1 hour prior to test. You may take your regular medications prior to the test.  Take metoprolol (Lopressor) two hours prior to test.      After the Test: Drink plenty of water. After receiving IV contrast, you may experience a mild flushed feeling. This is normal. On occasion, you may experience a mild rash up to 24 hours after the test. This is not dangerous. If this occurs, you can take Benadryl 25 mg and increase your fluid intake. If you experience trouble breathing, this can be serious. If it is severe call 911 IMMEDIATELY. If it is mild, please call our office. If you take any of these medications: Glipizide/Metformin, Avandament, Glucavance, please do not take 48 hours after completing test unless otherwise instructed.  We will call to schedule your test 2-4 weeks out understanding that some insurance companies will need an authorization prior to the service being performed.   For more information and frequently asked questions, please visit our website : [email or web address]  For non-scheduling related questions, please contact the cardiac imaging nurse navigator should you have  any questions/concerns: Cardiac Imaging Nurse Navigators Direct Office Dial: [phone]   For scheduling needs, including cancellations and rescheduling, please call [place], [phone].   Follow-Up: At [facility], you and your health needs are our priority.  As part of our  continuing mission to provide you with exceptional heart care, we have created designated Provider Care Teams.  These Care Teams include your primary Cardiologist (physician) and Advanced Practice Providers (APPs -  Physician Assistants and Nurse Practitioners) who all work together to provide you with the care you need, when you need it.  We recommend signing up for the patient portal called "MyChart".  Sign up information is provided on this After Visit Summary.  MyChart is used to connect with patients for Virtual Visits (Telemedicine).  Patients are able to view lab/test results, encounter notes, upcoming appointments, etc.  Non-urgent messages can be sent to your provider as well.   To learn more about what you can do with MyChart, go to [email or web address].    Your next appointment:    2 months   Provider:   Debbe Odea, MD ONLY

## 2023-09-11 LAB — BASIC METABOLIC PANEL
BUN/Creatinine Ratio: 8 — ABNORMAL LOW (ref 9–20)
BUN: 8 mg/dL (ref 6–24)
CO2: 25 mmol/L (ref 20–29)
Calcium: 9.5 mg/dL (ref 8.7–10.2)
Chloride: 103 mmol/L (ref 96–106)
Creatinine, Ser: 0.98 mg/dL (ref 0.76–1.27)
Glucose: 99 mg/dL (ref 70–99)
Potassium: 4.2 mmol/L (ref 3.5–5.2)
Sodium: 141 mmol/L (ref 134–144)
eGFR: 94 mL/min/{1.73_m2} (ref >59)

## 2023-09-11 LAB — LIPID PANEL
Chol/HDL Ratio: 4.7 ratio (ref 0.0–5.0)
Cholesterol, Total: 145 mg/dL (ref 100–199)
HDL: 31 mg/dL — ABNORMAL LOW (ref >39)
LDL Chol Calc (NIH): 96 mg/dL (ref 0–99)
Triglycerides: 96 mg/dL (ref 0–149)
VLDL Cholesterol Cal: 18 mg/dL (ref 5–40)

## 2023-09-15 ENCOUNTER — Telehealth (HOSPITAL_COMMUNITY): Payer: Self-pay | Admitting: *Deleted

## 2023-09-15 NOTE — ED Provider Notes (Signed)
[facility] Provider Note    Event Date/Time   First MD Initiated Contact with Patient 09/05/23 1851     (approximate)   History   Chest Pain   HPI  Charles [name] is a 52 y.o. male with no significant past medical history presents with complaints of intermittent chest pain over the last 5 or 6 months.  Pain is currently resolved at this time, does seem to be exertional at times.  He is a Naval [employer]     Physical Exam   Triage Vital Signs: ED Triage Vitals  Encounter Vitals Group     BP 09/05/23 1626 (!) 156/105     Systolic BP Percentile --      Diastolic BP Percentile --      Pulse Rate 09/05/23 1626 89     Resp 09/05/23 1626 18     Temp 09/05/23 1626 98.7 F (37.1 C)     Temp Source 09/05/23 1626 Oral     SpO2 09/05/23 1626 100 %     Weight 09/05/23 1624 90.7 kg (200 lb)     Height 09/05/23 1624 1.803 m (5\' 11" )     Head Circumference --      Peak Flow --      Pain Score 09/05/23 1624 9     Pain Loc --      Pain Education --      Exclude from Growth Chart --     Most recent vital signs: Vitals:   09/05/23 1626 09/05/23 1921  BP: (!) 156/105 (!) 145/99  Pulse: 89 87  Resp: 18 18  Temp: 98.7 F (37.1 C)   SpO2: 100% 100%     General: Awake, no distress.  CV:  Good peripheral perfusion.  Resp:  Normal effort.  Abd:  No distention.  Other:     ED Results / Procedures / Treatments   Labs (all labs ordered are listed, but only abnormal results are displayed) Labs Reviewed  BASIC METABOLIC PANEL - Abnormal; Notable for the following components:      Result Value   Potassium 3.4 (*)    Glucose, Bld 128 (*)    All other components within normal limits  CBC - Abnormal; Notable for the following components:   RBC 3.94 (*)    Hemoglobin 10.9 (*)    HCT 33.0 (*)    All other components within normal limits  TROPONIN I (HIGH SENSITIVITY)     EKG  ED ECG REPORT I, Jene Every, the attending physician, personally  viewed and interpreted this ECG.  Date: 09/15/2023  Rhythm: normal sinus rhythm QRS Axis: normal Intervals: normal ST/T Wave abnormalities: normal Narrative Interpretation: no evidence of acute ischemia    RADIOLOGY Chest x-ray viewed interpret by me, no acute abnormality    PROCEDURES:  Critical Care performed:   Procedures   MEDICATIONS ORDERED IN ED: Medications - No data to display   IMPRESSION / MDM / ASSESSMENT AND PLAN / ED COURSE  I reviewed the triage vital signs and the nursing notes. Patient's presentation is most consistent with acute presentation with potential threat to life or bodily function.  Patient presents with intermittent chest pain over the last 5 to 6 months.  He is currently chest pain-free.  He does describe chest pain when turning a crank to attach his truck to a trailer.  This generally gradually resolves after he rests.  Differential includes angina, ACS, musculoskeletal pain  Currently well-appearing and in no  distress, EKG is quite reassuring, high sensitive troponin is normal.  No indication for admission at this time, appropriate for discharge with close follow-up with cardiology, return precautions discussed, he agrees to this plan.        FINAL CLINICAL IMPRESSION(S) / ED DIAGNOSES   Final diagnoses:  Nonspecific chest pain     Rx / DC Orders   ED Discharge Orders          Ordered    Ambulatory referral to Cardiology       Comments: If you have not heard from the Cardiology office within the next 72 hours please call [phone].   09/05/23 1912             Note:  This document was prepared using Dragon voice recognition software and may include unintentional dictation errors.   Jene Every, MD 09/15/23 [phone]

## 2023-09-15 NOTE — Telephone Encounter (Signed)
Attempted to call patient regarding upcoming cardiac CT appointment. °Left message on voicemail with name and callback number ° °Edie Vallandingham RN Navigator Cardiac Imaging °[facility] °[phone] Office °[phone] Cell ° °

## 2023-09-16 ENCOUNTER — Ambulatory Visit
Admission: RE | Admit: 2023-09-16 | Discharge: 2023-09-16 | Disposition: A | Discharge status: Home / Self Care | Payer: 59 | Source: Ambulatory Visit | Attending: Cardiology | Admitting: Cardiology

## 2023-09-16 DIAGNOSIS — R072 Precordial pain: Secondary | ICD-10-CM | POA: Insufficient documentation

## 2023-09-16 MED ORDER — NITROGLYCERIN 0.4 MG SL SUBL
0.8000 mg | SUBLINGUAL_TABLET | Freq: Once | SUBLINGUAL | Status: AC
  Administered 2023-09-16: 0.8 mg via SUBLINGUAL

## 2023-09-16 MED ORDER — SODIUM CHLORIDE 0.9 % IV SOLN: INTRAVENOUS | Status: DC

## 2023-09-16 MED ORDER — IOHEXOL 350 MG/ML SOLN
75.0000 mL | Freq: Once | INTRAVENOUS | Status: AC | PRN
  Administered 2023-09-16: 75 mL via INTRAVENOUS

## 2023-09-16 NOTE — Progress Notes (Signed)
Patient tolerated procedure well. Ambulate w/o difficulty. Denies light headedness or being dizzy. Sitting in chair drinking water provided. Encouraged to drink extra water today and reasoning explained. Verbalized understanding. All questions answered. ABC intact. No further needs. Discharge from procedure area w/o issues.   °

## 2023-09-28 ENCOUNTER — Ambulatory Visit: Payer: 59 | Attending: Cardiology

## 2023-09-28 DIAGNOSIS — R072 Precordial pain: Secondary | ICD-10-CM | POA: Diagnosis not present

## 2023-09-28 LAB — ECHOCARDIOGRAM COMPLETE
Area-P 1/2: 3.85 cm2
S' Lateral: 3.2 cm

## 2023-11-05 ENCOUNTER — Ambulatory Visit: Payer: 59 | Attending: Cardiology | Admitting: Cardiology
